# Patient Record
Sex: Male | Born: 2000
Health system: Southern US, Community
[De-identification: ages and names within clinical notes are randomized; demographics above are authoritative.]

---
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
## 2022-08-02 DIAGNOSIS — Z0000 Encounter for general adult medical examination without abnormal findings: Secondary | ICD-10-CM | POA: Diagnosis not present

## 2022-08-02 DIAGNOSIS — F902 Attention-deficit hyperactivity disorder, combined type: Secondary | ICD-10-CM | POA: Diagnosis not present

## 2022-08-02 DIAGNOSIS — Z1331 Encounter for screening for depression: Secondary | ICD-10-CM | POA: Diagnosis not present

## 2022-08-17 ENCOUNTER — Encounter: Payer: Self-pay | Admitting: Dermatology

## 2022-08-17 ENCOUNTER — Ambulatory Visit (INDEPENDENT_AMBULATORY_CARE_PROVIDER_SITE_OTHER): Payer: 59 | Admitting: Dermatology

## 2022-08-17 DIAGNOSIS — L814 Other melanin hyperpigmentation: Secondary | ICD-10-CM | POA: Diagnosis not present

## 2022-08-17 DIAGNOSIS — D229 Melanocytic nevi, unspecified: Secondary | ICD-10-CM

## 2022-08-17 NOTE — Progress Notes (Signed)
   Follow-Up Visit   Subjective  Jim Bentley is a 22 y.o. male who presents for the following: Nevus (Patient here today for some spots at his scalp. They don't bother patient but they do get in the way with hair cuts. Hx of halo nevus).  Mother here and contributes to history.  The patient has spots, moles and lesions to be evaluated, some may be new or changing and the patient has concerns that these could be cancer.    The following portions of the chart were reviewed this encounter and updated as appropriate:   Allergies  Meds  Problems  Med Hx  Surg Hx  Fam Hx      Review of Systems:  No other skin or systemic complaints except as noted in HPI or Assessment and Plan.  Objective  Well appearing patient in no apparent distress; mood and affect are within normal limits.  A focused examination was performed including scalp, shoulders, back, flanks. Relevant physical exam findings are noted in the Assessment and Plan.    Assessment & Plan   Lentigines - Scattered tan macules - Due to sun exposure - Benign-appearing, observe - Recommend daily broad spectrum sunscreen SPF 30+ to sun-exposed areas, reapply every 2 hours as needed. - Call for any changes   Melanocytic Nevi - Tan-brown and/or pink-flesh-colored symmetric macules and papules - Benign appearing on exam today - Observation - Call clinic for new or changing moles - Recommend daily use of broad spectrum spf 30+ sunscreen to sun-exposed areas.    Skin cancer screening performed today.  Lentigo  Nevus   Return if symptoms worsen or fail to improve.  I, Emelia Salisbury, CMA, am acting as scribe for Forest Gleason, MD.  Documentation: I have reviewed the above documentation for accuracy and completeness, and I agree with the above.  Forest Gleason, MD

## 2022-08-17 NOTE — Patient Instructions (Signed)
Recommend taking Heliocare sun protection supplement daily in sunny weather for additional sun protection. For maximum protection on the sunniest days, you can take up to 2 capsules of regular Heliocare OR take 1 capsule of Heliocare Ultra. For prolonged exposure (such as a full day in the sun), you can repeat your dose of the supplement 4 hours after your first dose. Heliocare can be purchased at [facility], at some [facility] or at www.heliocare.com.    Recommend daily broad spectrum sunscreen SPF 30+ to sun-exposed areas, reapply every 2 hours as needed. Call for new or changing lesions.  Staying in the shade or wearing long sleeves, sun glasses (UVA+UVB protection) and wide brim hats (4-inch brim around the entire circumference of the hat) are also recommended for sun protection.    Melanoma ABCDEs  Melanoma is the most dangerous type of skin cancer, and is the leading cause of death from skin disease.  You are more likely to develop melanoma if you: Have light-colored skin, light-colored eyes, or red or blond hair Spend a lot of time in the sun Tan regularly, either outdoors or in a tanning bed Have had blistering sunburns, especially during childhood Have a close family member who has had a melanoma Have atypical moles or large birthmarks  Early detection of melanoma is key since treatment is typically straightforward and cure rates are extremely high if we catch it early.   The first sign of melanoma is often a change in a mole or a new dark spot.  The ABCDE system is a way of remembering the signs of melanoma.  A for asymmetry:  The two halves do not match. B for border:  The edges of the growth are irregular. C for color:  A mixture of colors are present instead of an even brown color. D for diameter:  Melanomas are usually (but not always) greater than 6mm - the size of a pencil eraser. E for evolution:  The spot keeps changing in size, shape, and color.  Please check  your skin once per month between visits. You can use a small mirror in front and a large mirror behind you to keep an eye on the back side or your body.   If you see any new or changing lesions before your next follow-up, please call to schedule a visit.  Please continue daily skin protection including broad spectrum sunscreen SPF 30+ to sun-exposed areas, reapplying every 2 hours as needed when you're outdoors.   Staying in the shade or wearing long sleeves, sun glasses (UVA+UVB protection) and wide brim hats (4-inch brim around the entire circumference of the hat) are also recommended for sun protection.    Due to recent changes in healthcare laws, you may see results of your pathology and/or laboratory studies on MyChart before the doctors have had a chance to review them. We understand that in some cases there may be results that are confusing or concerning to you. Please understand that not all results are received at the same time and often the doctors may need to interpret multiple results in order to provide you with the best plan of care or course of treatment. Therefore, we ask that you please give us 2 business days to thoroughly review all your results before contacting the office for clarification. Should we see a critical lab result, you will be contacted sooner.   If You Need Anything After Your Visit  If you have any questions or concerns for your doctor, please   call our main line at [phone] and press option 4 to reach your doctor's medical assistant. If no one answers, please leave a voicemail as directed and we will return your call as soon as possible. Messages left after 4 pm will be answered the following business day.   You may also send us a message via MyChart. We typically respond to MyChart messages within 1-2 business days.  For prescription refills, please ask your pharmacy to contact our office. Our fax number is [phone].  If you have an urgent issue when the  clinic is closed that cannot wait until the next business day, you can page your doctor at the number below.    Please note that while we do our best to be available for urgent issues outside of office hours, we are not available 24/7.   If you have an urgent issue and are unable to reach us, you may choose to seek medical care at your doctor's office, retail clinic, urgent care center, or emergency room.  If you have a medical emergency, please immediately call 911 or go to the emergency department.  Pager Numbers  - Dr. Kowalski: [phone]  - Dr. Moye: [phone]  - Dr. Stewart: [phone]  In the event of inclement weather, please call our main line at [phone] for an update on the status of any delays or closures.  Dermatology Medication Tips: Please keep the boxes that topical medications come in in order to help keep track of the instructions about where and how to use these. Pharmacies typically print the medication instructions only on the boxes and not directly on the medication tubes.   If your medication is too expensive, please contact our office at [phone] option 4 or send us a message through MyChart.   We are unable to tell what your co-pay for medications will be in advance as this is different depending on your insurance coverage. However, we may be able to find a substitute medication at lower cost or fill out paperwork to get insurance to cover a needed medication.   If a prior authorization is required to get your medication covered by your insurance company, please allow us 1-2 business days to complete this process.  Drug prices often vary depending on where the prescription is filled and some pharmacies may offer cheaper prices.  The website www.goodrx.com contains coupons for medications through different pharmacies. The prices here do not account for what the cost may be with help from insurance (it may be cheaper with your insurance), but the  website can give you the price if you did not use any insurance.  - You can print the associated coupon and take it with your prescription to the pharmacy.  - You may also stop by our office during regular business hours and pick up a GoodRx coupon card.  - If you need your prescription sent electronically to a different pharmacy, notify our office through [facility] MyChart or by phone at [phone] option 4.     Si Usted Necesita Algo Despus de Su Visita  Tambin puede enviarnos un mensaje a travs de MyChart. Por lo general respondemos a los mensajes de MyChart en el transcurso de 1 a 2 das hbiles.  Para renovar recetas, por favor pida a su farmacia que se ponga en contacto con nuestra oficina. Nuestro nmero de fax es el [phone].  Si tiene un asunto urgente cuando la clnica est cerrada y que no puede esperar hasta el siguiente da hbil,   puede llamar/localizar a su doctor(a) al nmero que aparece a continuacin.   Por favor, tenga en cuenta que aunque hacemos todo lo posible para estar disponibles para asuntos urgentes fuera del horario de oficina, no estamos disponibles las 24 horas del da, los 7 das de la semana.   Si tiene un problema urgente y no puede comunicarse con nosotros, puede optar por buscar atencin mdica  en el consultorio de su doctor(a), en una clnica privada, en un centro de atencin urgente o en una sala de emergencias.  Si tiene una emergencia mdica, por favor llame inmediatamente al 911 o vaya a la sala de emergencias.  Nmeros de bper  - Dr. Kowalski: [phone]  - Dra. Moye: [phone]  - Dra. Stewart: [phone]  En caso de inclemencias del tiempo, por favor llame a nuestra lnea principal al [phone] para una actualizacin sobre el estado de cualquier retraso o cierre.  Consejos para la medicacin en dermatologa: Por favor, guarde las cajas en las que vienen los medicamentos de uso tpico para ayudarle a seguir las  instrucciones sobre dnde y cmo usarlos. Las farmacias generalmente imprimen las instrucciones del medicamento slo en las cajas y no directamente en los tubos del medicamento.   Si su medicamento es muy caro, por favor, pngase en contacto con nuestra oficina llamando al [phone] y presione la opcin 4 o envenos un mensaje a travs de MyChart.   No podemos decirle cul ser su copago por los medicamentos por adelantado ya que esto es diferente dependiendo de la cobertura de su seguro. Sin embargo, es posible que podamos encontrar un medicamento sustituto a menor costo o llenar un formulario para que el seguro cubra el medicamento que se considera necesario.   Si se requiere una autorizacin previa para que su compaa de seguros cubra su medicamento, por favor permtanos de 1 a 2 das hbiles para completar este proceso.  Los precios de los medicamentos varan con frecuencia dependiendo del lugar de dnde se surte la receta y alguna farmacias pueden ofrecer precios ms baratos.  El sitio web www.goodrx.com tiene cupones para medicamentos de diferentes farmacias. Los precios aqu no tienen en cuenta lo que podra costar con la ayuda del seguro (puede ser ms barato con su seguro), pero el sitio web puede darle el precio si no utiliz ningn seguro.  - Puede imprimir el cupn correspondiente y llevarlo con su receta a la farmacia.  - Tambin puede pasar por nuestra oficina durante el horario de atencin regular y recoger una tarjeta de cupones de GoodRx.  - Si necesita que su receta se enve electrnicamente a una farmacia diferente, informe a nuestra oficina a travs de MyChart de [place] o por telfono llamando al [phone] y presione la opcin 4.  

## 2022-08-25 ENCOUNTER — Encounter: Payer: Self-pay | Admitting: Dermatology

## 2022-08-25 DIAGNOSIS — D229 Melanocytic nevi, unspecified: Secondary | ICD-10-CM | POA: Insufficient documentation

## 2023-01-04 ENCOUNTER — Other Ambulatory Visit: Payer: Self-pay

## 2023-01-04 DIAGNOSIS — Z021 Encounter for pre-employment examination: Secondary | ICD-10-CM

## 2023-01-04 NOTE — Progress Notes (Signed)
Completed and cleared urine pre employment drug screen for [employer].
